# Patient Record
Sex: Female | Born: 1995 | Race: White | Hispanic: No | Marital: Single | Smoking: Never smoker
Health system: Southern US, Community
[De-identification: ages and names within clinical notes are randomized; demographics above are authoritative.]

## PROBLEM LIST (undated history)

## (undated) DIAGNOSIS — L509 Urticaria, unspecified: Secondary | ICD-10-CM

## (undated) DIAGNOSIS — J45909 Unspecified asthma, uncomplicated: Secondary | ICD-10-CM

## (undated) HISTORY — PX: TYMPANOSTOMY TUBE PLACEMENT: SHX32

---
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
## 2018-01-11 DIAGNOSIS — Z3041 Encounter for surveillance of contraceptive pills: Secondary | ICD-10-CM | POA: Diagnosis not present

## 2018-01-11 DIAGNOSIS — Z13220 Encounter for screening for lipoid disorders: Secondary | ICD-10-CM | POA: Diagnosis not present

## 2018-01-11 DIAGNOSIS — J301 Allergic rhinitis due to pollen: Secondary | ICD-10-CM | POA: Diagnosis not present

## 2018-01-11 DIAGNOSIS — Z0000 Encounter for general adult medical examination without abnormal findings: Secondary | ICD-10-CM | POA: Diagnosis not present

## 2018-01-11 DIAGNOSIS — J45990 Exercise induced bronchospasm: Secondary | ICD-10-CM | POA: Diagnosis not present

## 2018-01-11 DIAGNOSIS — R634 Abnormal weight loss: Secondary | ICD-10-CM | POA: Diagnosis not present

## 2018-01-11 DIAGNOSIS — Z131 Encounter for screening for diabetes mellitus: Secondary | ICD-10-CM | POA: Diagnosis not present

## 2018-01-29 DIAGNOSIS — R809 Proteinuria, unspecified: Secondary | ICD-10-CM | POA: Diagnosis not present

## 2019-01-29 DIAGNOSIS — Z23 Encounter for immunization: Secondary | ICD-10-CM | POA: Diagnosis not present

## 2019-01-29 DIAGNOSIS — Z0000 Encounter for general adult medical examination without abnormal findings: Secondary | ICD-10-CM | POA: Diagnosis not present

## 2019-01-29 DIAGNOSIS — Z124 Encounter for screening for malignant neoplasm of cervix: Secondary | ICD-10-CM | POA: Diagnosis not present

## 2019-01-29 DIAGNOSIS — Z3041 Encounter for surveillance of contraceptive pills: Secondary | ICD-10-CM | POA: Diagnosis not present

## 2019-06-04 DIAGNOSIS — Z23 Encounter for immunization: Secondary | ICD-10-CM | POA: Diagnosis not present

## 2020-01-17 ENCOUNTER — Other Ambulatory Visit: Payer: Self-pay

## 2020-01-17 ENCOUNTER — Encounter: Payer: Self-pay | Admitting: Emergency Medicine

## 2020-01-17 ENCOUNTER — Ambulatory Visit
Admission: EM | Admit: 2020-01-17 | Discharge: 2020-01-17 | Disposition: A | Discharge status: Home / Self Care | Payer: 59 | Attending: Physician Assistant | Admitting: Physician Assistant

## 2020-01-17 DIAGNOSIS — L539 Erythematous condition, unspecified: Secondary | ICD-10-CM

## 2020-01-17 DIAGNOSIS — W57XXXA Bitten or stung by nonvenomous insect and other nonvenomous arthropods, initial encounter: Secondary | ICD-10-CM | POA: Diagnosis not present

## 2020-01-17 DIAGNOSIS — S80861A Insect bite (nonvenomous), right lower leg, initial encounter: Secondary | ICD-10-CM | POA: Diagnosis not present

## 2020-01-17 MED ORDER — METHYLPREDNISOLONE 4 MG PO TBPK: ORAL_TABLET | ORAL | 0 refills | Status: AC

## 2020-01-17 NOTE — ED Provider Notes (Signed)
[facility]    CSN: [id number] Arrival date & time: 01/17/20  1505      History   Chief Complaint Chief Complaint  Patient presents with  . Insect Bite    HPI Rita Walker is a 24 y.o. female.   24 year old female presents for insect bite or sting of the right ankle.  She says she was bit or stung by something about 2 days ago.  She has been taking Zyrtec every other day and says that the area of redness and swelling seems to be getting a little bigger.  It is tender to touch and itchy.  She denies fever, fatigue, pain on weightbearing, numbness or tingling.  No other concerns today.     Past Medical History:  Diagnosis Date  . Asthma   . Hives     There are no problems to display for this patient.   Past Surgical History:  Procedure Laterality Date  . TYMPANOSTOMY TUBE PLACEMENT      OB History   No obstetric history on file.      Home Medications    Prior to Admission medications   Medication Sig Start Date End Date Taking? Authorizing Provider  cetirizine (ZYRTEC) 10 MG tablet Take 10 mg by mouth daily.   Yes [provider]  drospirenone-ethinyl estradiol (YASMIN,ZARAH,SYEDA) 3-0.03 MG tablet Take 1 tablet by mouth daily.   Yes [provider]  albuterol (PROVENTIL) (2.5 MG/3ML) 0.083% nebulizer solution Take 2.5 mg by nebulization every 6 (six) hours as needed for wheezing or shortness of breath.    [provider]  methylPREDNISolone (MEDROL DOSEPAK) 4 MG TBPK tablet Take according to dose pack instructions 01/17/20 01/23/20  Eusebio Friendly B, PA-C  montelukast (SINGULAIR) 10 MG tablet Take 10 mg by mouth at bedtime.    [provider]  ranitidine (ZANTAC) 150 MG capsule Take 150 mg twice daily for next 5-7 days 11/30/14   Rae Halsted, PA-C    Family History Family History  Problem Relation Age of Onset  . Healthy Father   . Healthy Mother     Social History Social History   Tobacco Use  . Smoking  status: Never Smoker  . Smokeless tobacco: Never Used  Vaping Use  . Vaping Use: Never used  Substance Use Topics  . Alcohol use: No  . Drug use: No     Allergies   Patient has no known allergies.   Review of Systems Review of Systems  Constitutional: Negative for fatigue and fever.  Musculoskeletal: Positive for joint swelling. Negative for arthralgias and myalgias.  Skin: Positive for color change. Negative for wound.  Allergic/Immunologic: Negative for immunocompromised state.  Neurological: Negative for weakness and numbness.  Hematological: Negative for adenopathy. Does not bruise/bleed easily.     Physical Exam Triage Vital Signs ED Triage Vitals  Enc Vitals Group     BP 01/17/20 1537 111/81     Pulse Rate 01/17/20 1537 61     Resp 01/17/20 1537 18     Temp 01/17/20 1537 98.8 F (37.1 C)     Temp Source 01/17/20 1537 Oral     SpO2 01/17/20 1537 100 %     Weight 01/17/20 1532 132 lb 0.9 oz (59.9 kg)     Height 01/17/20 1532 5\' 6"  (1.676 m)     Head Circumference --      Peak Flow --      Pain Score 01/17/20 1532 5     Pain Loc --  Pain Edu? --      Excl. in GC? --    No data found.  Updated Vital Signs BP 111/81 (BP Location: Left Arm)   Pulse 61   Temp 98.8 F (37.1 C) (Oral)   Resp 18   Ht 5\' 6"  (1.676 m)   Wt 132 lb 0.9 oz (59.9 kg)   LMP 01/14/2020   SpO2 100%   BMI 21.31 kg/m       Physical Exam Vitals and nursing note reviewed.  Constitutional:      General: She is not in acute distress.    Appearance: Normal appearance. She is not ill-appearing or toxic-appearing.  HENT:     Head: Normocephalic and atraumatic.  Eyes:     General: No scleral icterus.       Right eye: No discharge.        Left eye: No discharge.     Conjunctiva/sclera: Conjunctivae normal.  Cardiovascular:     Rate and Rhythm: Normal rate and regular rhythm.  Pulmonary:     Effort: Pulmonary effort is normal. No respiratory distress.  Musculoskeletal:      Cervical back: Neck supple.  Skin:    General: Skin is dry.     Findings: Erythema (there is an area of erythema and mild swelling of the right anterio ankle ~3 cm x 2.5 cm. Area mildly tender to touch and warm. No fluctuance or drainage) present.  Neurological:     General: No focal deficit present.     Mental Status: She is alert. Mental status is at baseline.     Motor: No weakness.     Gait: Gait normal.  Psychiatric:        Mood and Affect: Mood normal.        Behavior: Behavior normal.        Thought Content: Thought content normal.      UC Treatments / Results  Labs (all labs ordered are listed, but only abnormal results are displayed) Labs Reviewed - No data to display  EKG   Radiology No results found.  Procedures Procedures (including critical care time)  Medications Ordered in UC Medications - No data to display  Initial Impression / Assessment and Plan / UC Course  I have reviewed the triage vital signs and the nursing notes.  Pertinent labs & imaging results that were available during my care of the patient were reviewed by me and considered in my medical decision making (see chart for details).   Advised patient that condition is consistent with localized inflammatory reaction due to insect bite or sting.  Advised her that it should get better with antihistamines more frequently, icing and elevating the foot/ankle, starting Medrol.  Advised her to keep a close eye on the area and if it is worsening or not better within a couple of days she should call our office and request antibiotics.  I discussed sending it for her at this time but she said she plans to wait and only call if she needs it.  ED precautions discussed.  Final Clinical Impressions(s) / UC Diagnoses   Final diagnoses:  Insect bite of right lower leg, initial encounter  Erythematous condition     Discharge Instructions     Condition is consistent with a localized inflammatory action due to  insect bite or sting.  Take Benadryl tablets every 4-6 hours.  Also you can ice the area of swelling and elevate the foot.  Begin Medrol Dosepak today.  Drawn outline  of the area of redness and if it is spreading or if you have not started to have some relief with this treatment plan in the next 1 to 2 days, you should start antibiotics.  Call our office if you want [place] to prescribe them.    ED Prescriptions    Medication Sig Dispense Auth. Provider   methylPREDNISolone (MEDROL DOSEPAK) 4 MG TBPK tablet Take according to dose pack instructions 21 tablet Shirlee Latch, PA-C     PDMP not reviewed this encounter.   Shirlee Latch, PA-C 01/17/20 1600

## 2020-01-17 NOTE — Discharge Instructions (Addendum)
Condition is consistent with a localized inflammatory action due to insect bite or sting.  Take Benadryl tablets every 4-6 hours.  Also you can ice the area of swelling and elevate the foot.  Begin Medrol Dosepak today.  Drawn outline of the area of redness and if it is spreading or if you have not started to have some relief with this treatment plan in the next 1 to 2 days, you should start antibiotics.  Call our office if you want [place] to prescribe them.

## 2020-01-17 NOTE — ED Triage Notes (Signed)
Pt c/o insect bite on her ankle area. Area is swollen, red, warm, itchy and painful. Her foot also looks swollen. Started about 2 days ago. 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 She has tried oral antihistamines the last couple eo days without relief.

## 2020-07-02 DIAGNOSIS — R062 Wheezing: Secondary | ICD-10-CM | POA: Diagnosis not present

## 2020-07-02 DIAGNOSIS — Z3041 Encounter for surveillance of contraceptive pills: Secondary | ICD-10-CM | POA: Diagnosis not present

## 2020-10-07 DIAGNOSIS — Z23 Encounter for immunization: Secondary | ICD-10-CM | POA: Diagnosis not present

## 2020-10-07 DIAGNOSIS — J4520 Mild intermittent asthma, uncomplicated: Secondary | ICD-10-CM | POA: Diagnosis not present

## 2020-10-07 DIAGNOSIS — N898 Other specified noninflammatory disorders of vagina: Secondary | ICD-10-CM | POA: Diagnosis not present

## 2020-10-07 DIAGNOSIS — Z114 Encounter for screening for human immunodeficiency virus [HIV]: Secondary | ICD-10-CM | POA: Diagnosis not present

## 2020-10-07 DIAGNOSIS — Z0000 Encounter for general adult medical examination without abnormal findings: Secondary | ICD-10-CM | POA: Diagnosis not present

## 2020-10-07 DIAGNOSIS — Z1159 Encounter for screening for other viral diseases: Secondary | ICD-10-CM | POA: Diagnosis not present

## 2020-10-07 DIAGNOSIS — R69 Illness, unspecified: Secondary | ICD-10-CM | POA: Diagnosis not present

## 2020-10-07 DIAGNOSIS — D229 Melanocytic nevi, unspecified: Secondary | ICD-10-CM | POA: Diagnosis not present

## 2020-10-07 DIAGNOSIS — Z113 Encounter for screening for infections with a predominantly sexual mode of transmission: Secondary | ICD-10-CM | POA: Diagnosis not present

## 2020-10-07 DIAGNOSIS — Z13220 Encounter for screening for lipoid disorders: Secondary | ICD-10-CM | POA: Diagnosis not present

## 2020-10-07 DIAGNOSIS — Z131 Encounter for screening for diabetes mellitus: Secondary | ICD-10-CM | POA: Diagnosis not present

## 2020-11-27 ENCOUNTER — Ambulatory Visit (INDEPENDENT_AMBULATORY_CARE_PROVIDER_SITE_OTHER): Payer: 59

## 2020-11-27 ENCOUNTER — Other Ambulatory Visit: Payer: Self-pay

## 2020-11-27 ENCOUNTER — Ambulatory Visit
Admission: EM | Admit: 2020-11-27 | Discharge: 2020-11-27 | Disposition: A | Discharge status: Home / Self Care | Payer: 59 | Attending: Emergency Medicine | Admitting: Emergency Medicine

## 2020-11-27 DIAGNOSIS — R059 Cough, unspecified: Secondary | ICD-10-CM | POA: Diagnosis not present

## 2020-11-27 DIAGNOSIS — J4521 Mild intermittent asthma with (acute) exacerbation: Secondary | ICD-10-CM | POA: Diagnosis not present

## 2020-11-27 MED ORDER — BENZONATATE 100 MG PO CAPS: 200.0000 mg | ORAL_CAPSULE | Freq: Three times a day (TID) | ORAL | 0 refills | Status: DC

## 2020-11-27 MED ORDER — AEROCHAMBER MV MISC: 2 refills | Status: DC

## 2020-11-27 MED ORDER — PREDNISONE 50 MG PO TABS: ORAL_TABLET | ORAL | 0 refills | Status: DC

## 2020-11-27 NOTE — Discharge Instructions (Addendum)
Continue to use your albuterol inhaler with a spacer, 2 puffs every 4-6 hours as needed for cough or shortness of breath.  Tomorrow morning start taking the prednisone.  You will take 50 mg with breakfast each morning for period of 5 days.  Use the Tessalon Perles every 8 hours as needed for cough.  Take these with a small sip of water.  They may give you numbness to the base of your tongue or metallic taste in her mouth, this is normal.  Return for reevaluation, or see your primary care provider, for any new or worsening symptoms.

## 2020-11-27 NOTE — ED Triage Notes (Signed)
Patient states that she has been having respiratory symptoms x months. States that on the 10th she had a coughing spell where she feels like she cannot catch her breath, states that this has happened several times intermittently. States that she feels like this has improved some. States that she has been able to use her inhaler and it gets better.

## 2020-11-27 NOTE — ED Provider Notes (Signed)
[facility]    CSN: [id number] Arrival date & time: 11/27/20  1601      History   Chief Complaint Chief Complaint  Patient presents with   Cough    HPI Rita Walker is a 25 y.o. female.   HPI  25 year old female here for evaluation of cough.  Patient reports that for the last 5 days she has been experiencing intermittent coughing spells that seem to be worse when she is laying down, eating, or talking.  These episodes wax and wane.  Occasionally they are associated with wheezing and chest tightness.  In general her cough is nonproductive.  She has not had a fever or any upper respiratory symptoms.  She reports that her symptoms started after she traveled to [place] but she has had no travel out of the country.  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  She does have an albuterol inhaler which she reports helps for short periods of time with her cough.  Past Medical History:  Diagnosis Date   Asthma    Hives     There are no problems to display for this patient.   Past Surgical History:  Procedure Laterality Date   TYMPANOSTOMY TUBE PLACEMENT      OB History   No obstetric history on file.      Home Medications    Prior to Admission medications   Medication Sig Start Date End Date Taking? Authorizing Provider  albuterol (PROVENTIL) (2.5 MG/3ML) 0.083% nebulizer solution Take 2.5 mg by nebulization every 6 (six) hours as needed for wheezing or shortness of breath.   Yes [provider]  benzonatate (TESSALON) 100 MG capsule Take 2 capsules (200 mg total) by mouth every 8 (eight) hours. 11/27/20  Yes Becky Augusta, NP  cetirizine (ZYRTEC) 10 MG tablet Take 10 mg by mouth daily.   Yes [provider]  drospirenone-ethinyl estradiol (YASMIN,ZARAH,SYEDA) 3-0.03 MG tablet Take 1 tablet by mouth daily.   Yes [provider]  predniSONE (DELTASONE) 50 MG  tablet Take 1 tablet daily for 5 days. 11/27/20  Yes Becky Augusta, NP  Spacer/Aero-Holding Deretha Emory (AEROCHAMBER MV) inhaler Use as instructed 11/27/20  Yes Becky Augusta, NP    Family History Family History  Problem Relation Age of Onset   Healthy Father    Healthy Mother     Social History Social History   Tobacco Use   Smoking status: Never   Smokeless tobacco: Never  Vaping Use   Vaping Use: Never used  Substance Use Topics   Alcohol use: No   Drug use: No     Allergies   Patient has no known allergies.   Review of Systems Review of Systems  Constitutional:  Negative for fever.  HENT:  Negative for congestion, ear pain, rhinorrhea and sore throat.   Respiratory:  Positive for cough, chest tightness, shortness of breath and wheezing.   Cardiovascular:  Negative for chest pain.  Hematological: Negative.   Psychiatric/Behavioral: Negative.      Physical Exam Triage Vital Signs ED Triage Vitals  Enc Vitals Group     BP 11/27/20 1623 116/80     Pulse Rate 11/27/20 1623 82     Resp 11/27/20 1623 18     Temp 11/27/20 1623 98.8 F (37.1 C)     Temp Source 11/27/20 1623 Oral     SpO2 11/27/20 1623 99 %     Weight  11/27/20 1622 120 lb (54.4 kg)     Height 11/27/20 1622 5\' 6"  (1.676 m)     Head Circumference --      Peak Flow --      Pain Score 11/27/20 1622 0     Pain Loc --      Pain Edu? --      Excl. in GC? --    No data found.  Updated Vital Signs BP 116/80 (BP Location: Right Arm)   Pulse 82   Temp 98.8 F (37.1 C) (Oral)   Resp 18   Ht 5\' 6"  (1.676 m)   Wt 120 lb (54.4 kg)   SpO2 99%   BMI 19.37 kg/m   Visual Acuity Right Eye Distance:   Left Eye Distance:   Bilateral Distance:    Right Eye Near:   Left Eye Near:    Bilateral Near:     Physical Exam Vitals and nursing note reviewed.  Constitutional:      General: She is not in acute distress.    Appearance: Normal appearance. She is normal weight. She is not ill-appearing.  HENT:      Head: Normocephalic and atraumatic.     Nose: Nose normal. No congestion or rhinorrhea.     Mouth/Throat:     Mouth: Mucous membranes are moist.     Pharynx: Oropharynx is clear. No posterior oropharyngeal erythema.  Cardiovascular:     Rate and Rhythm: Normal rate and regular rhythm.     Pulses: Normal pulses.     Heart sounds: Normal heart sounds. No murmur heard.   No gallop.  Pulmonary:     Effort: Pulmonary effort is normal.     Breath sounds: No wheezing, rhonchi or rales.  Musculoskeletal:     Cervical back: Normal range of motion and neck supple.  Lymphadenopathy:     Cervical: No cervical adenopathy.  Skin:    General: Skin is warm and dry.     Capillary Refill: Capillary refill takes less than 2 seconds.     Findings: No erythema or rash.  Neurological:     General: No focal deficit present.     Mental Status: She is alert and oriented to person, place, and time.  Psychiatric:        Mood and Affect: Mood normal.        Behavior: Behavior normal.        Thought Content: Thought content normal.        Judgment: Judgment normal.     UC Treatments / Results  Labs (all labs ordered are listed, but only abnormal results are displayed) Labs Reviewed - No data to display  EKG   Radiology DG Chest 2 View  Result Date: 11/27/2020 CLINICAL DATA:  Cough EXAM: CHEST - 2 VIEW COMPARISON:  None. FINDINGS: The heart size and mediastinal contours are within normal limits. Both lungs are clear. The visualized skeletal structures are unremarkable. IMPRESSION: No active cardiopulmonary disease. Electronically Signed   By: M.D.   On: 11/27/2020 17:22    Procedures Procedures (including critical care time)  Medications Ordered in UC Medications - No data to display  Initial Impression / Assessment and Plan / UC Course  I have reviewed the triage vital signs and the nursing notes.  Pertinent labs & imaging results that were available during my care of the  patient were reviewed by me and considered in my medical decision making (see chart for details).  Patient is a  very pleasant, nontoxic-appearing 25 year old female here for evaluation of respiratory complaints as outlined in the HPI above.  Patient is able to speak in full sentences without any cough or dyspnea.  Patient's cardiopulmonary exam reveals clear lung sounds in all fields.  Heart sounds are S1-S2.  Upper respiratory exam reveals pink and moist nasal mucosa without erythema, edema, or discharge.  Oropharyngeal exam is benign.  No cervical lymphadenopathy appreciated exam.  Suspect patient has mild cough variant asthma.  Will obtain chest x-ray to look for hyper inflation or other cardiopulmonary irregularity.  Chest x-ray independently reviewed and evaluated by me.  Interpretation: No evidence of infiltrate or effusion.  There is mild hyperinflation present.  Awaiting radiology overread. Radiology over read states no active cardiopulmonary disease.  We will treat patient for cough or an asthma flare and have her continue to use her albuterol inhaler, 2 puffs with a spacer every 4-6 hours, start prednisone 50 mg daily starting tomorrow morning, and will give Tessalon Perles to help with cough symptoms.  Final Clinical Impressions(s) / UC Diagnoses   Final diagnoses:  Mild intermittent asthma with exacerbation     Discharge Instructions      Continue to use your albuterol inhaler with a spacer, 2 puffs every 4-6 hours as needed for cough or shortness of breath.  Tomorrow morning start taking the prednisone.  You will take 50 mg with breakfast each morning for period of 5 days.  Use the Tessalon Perles every 8 hours as needed for cough.  Take these with a small sip of water.  They may give you numbness to the base of your tongue or metallic taste in her mouth, this is normal.  Return for reevaluation, or see your primary care provider, for any new or worsening symptoms.     ED  Prescriptions     Medication Sig Dispense Auth. Provider   Spacer/Aero-Holding Chambers (AEROCHAMBER MV) inhaler Use as instructed 1 each Becky Augusta, NP   benzonatate (TESSALON) 100 MG capsule Take 2 capsules (200 mg total) by mouth every 8 (eight) hours. 21 capsule Becky Augusta, NP   predniSONE (DELTASONE) 50 MG tablet Take 1 tablet daily for 5 days. 5 tablet Becky Augusta, NP      PDMP not reviewed this encounter.   Becky Augusta, NP 11/27/20 1734

## 2020-12-25 DIAGNOSIS — R062 Wheezing: Secondary | ICD-10-CM | POA: Diagnosis not present

## 2020-12-25 DIAGNOSIS — R053 Chronic cough: Secondary | ICD-10-CM | POA: Diagnosis not present

## 2021-03-15 DIAGNOSIS — S99912A Unspecified injury of left ankle, initial encounter: Secondary | ICD-10-CM | POA: Diagnosis not present

## 2021-03-15 DIAGNOSIS — M7989 Other specified soft tissue disorders: Secondary | ICD-10-CM | POA: Diagnosis not present

## 2021-07-28 DIAGNOSIS — R69 Illness, unspecified: Secondary | ICD-10-CM | POA: Diagnosis not present

## 2021-07-28 DIAGNOSIS — Z124 Encounter for screening for malignant neoplasm of cervix: Secondary | ICD-10-CM | POA: Diagnosis not present

## 2021-07-28 DIAGNOSIS — N898 Other specified noninflammatory disorders of vagina: Secondary | ICD-10-CM | POA: Diagnosis not present

## 2021-07-28 DIAGNOSIS — Z113 Encounter for screening for infections with a predominantly sexual mode of transmission: Secondary | ICD-10-CM | POA: Diagnosis not present

## 2021-07-28 DIAGNOSIS — Z01419 Encounter for gynecological examination (general) (routine) without abnormal findings: Secondary | ICD-10-CM | POA: Diagnosis not present

## 2021-08-03 LAB — HM PAP SMEAR: HM Pap smear: NORMAL

## 2021-12-14 IMAGING — CR DG CHEST 2V
3 series · 3 of 3 positions shown · non-contrast
Comparison: None.

CLINICAL DATA: Cough

EXAM:
CHEST - 2 VIEW

[chest pa (1 of 2)]
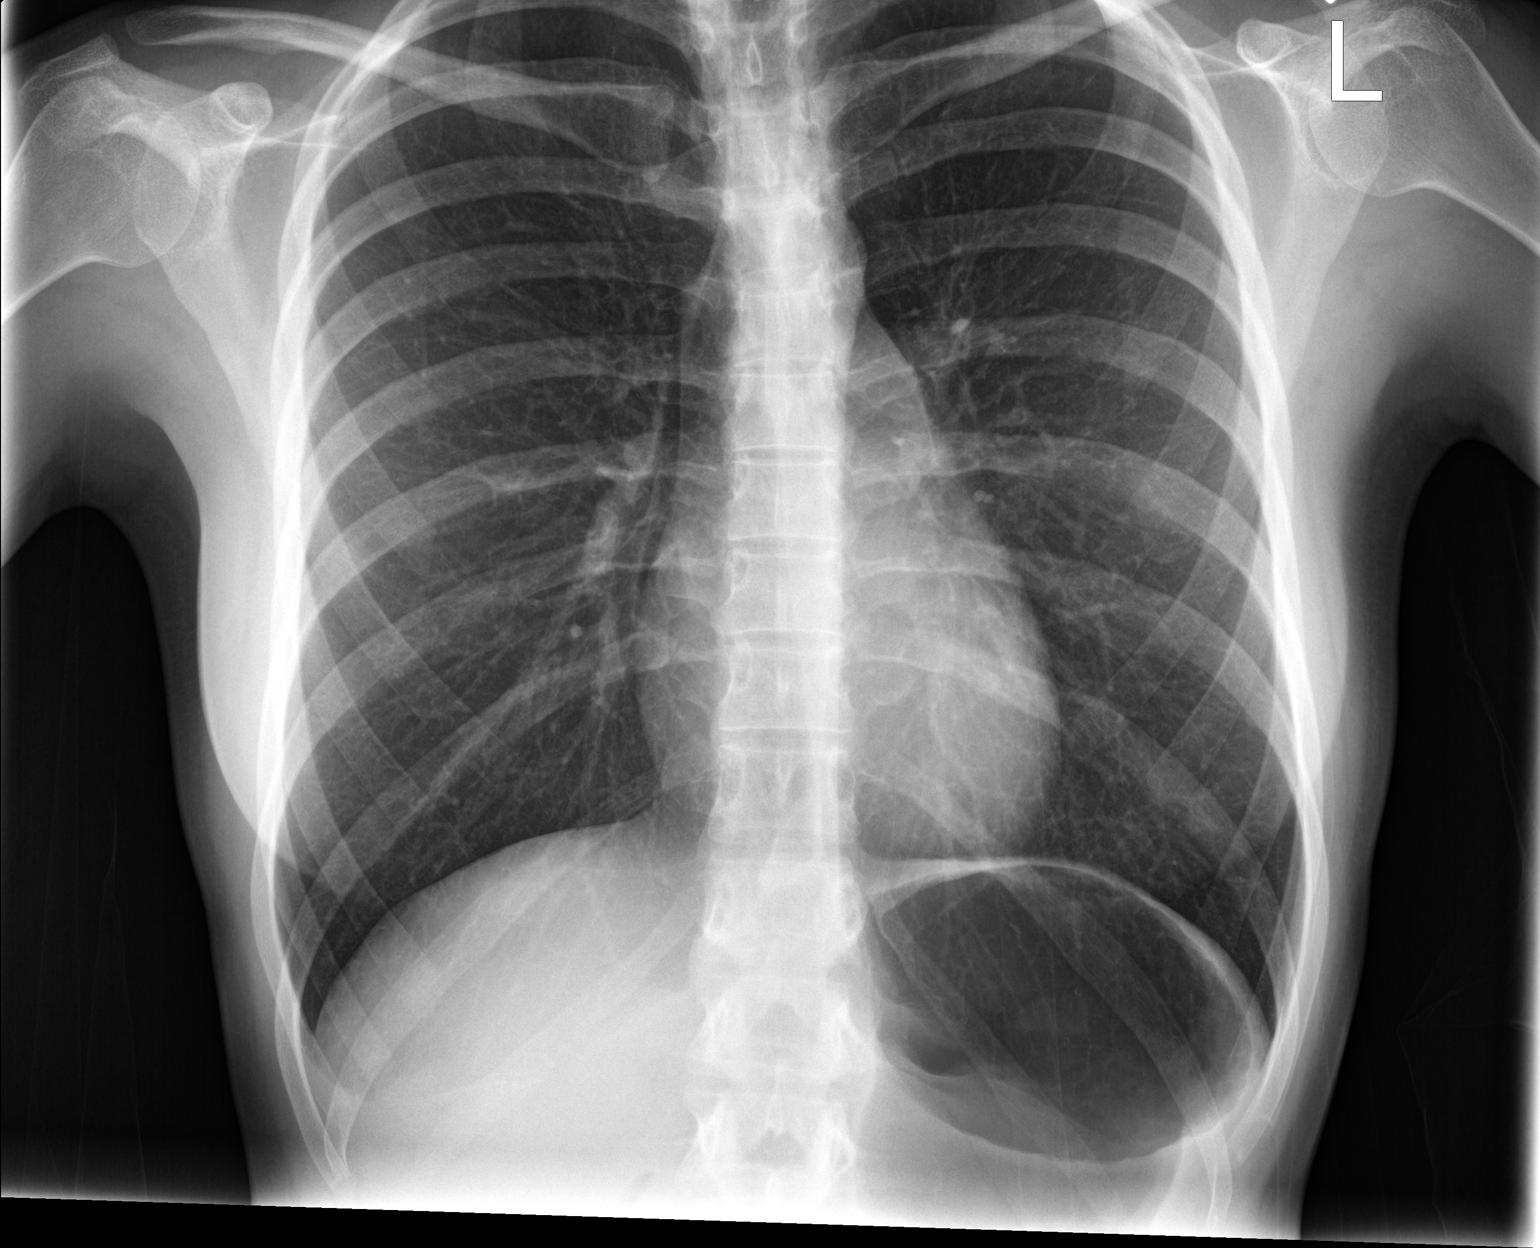

[chest pa (2 of 2)]
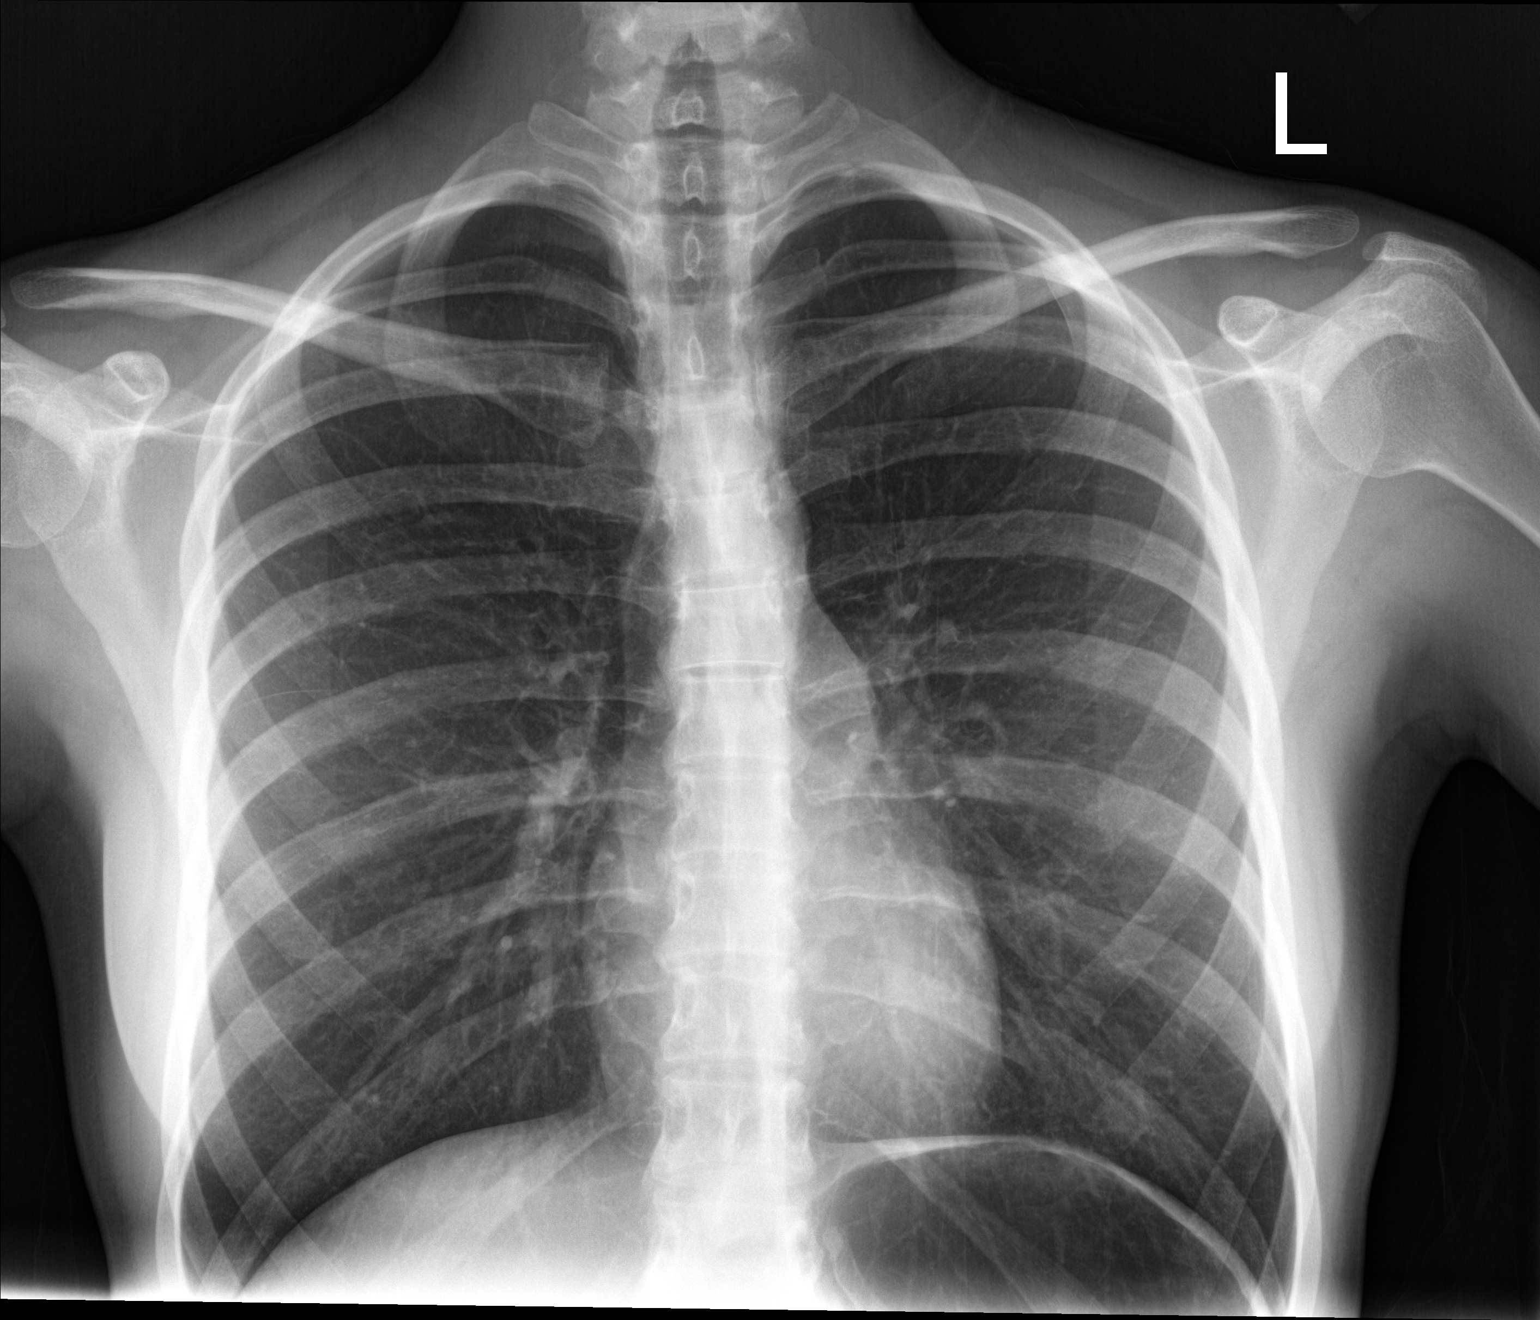

[chest lat]
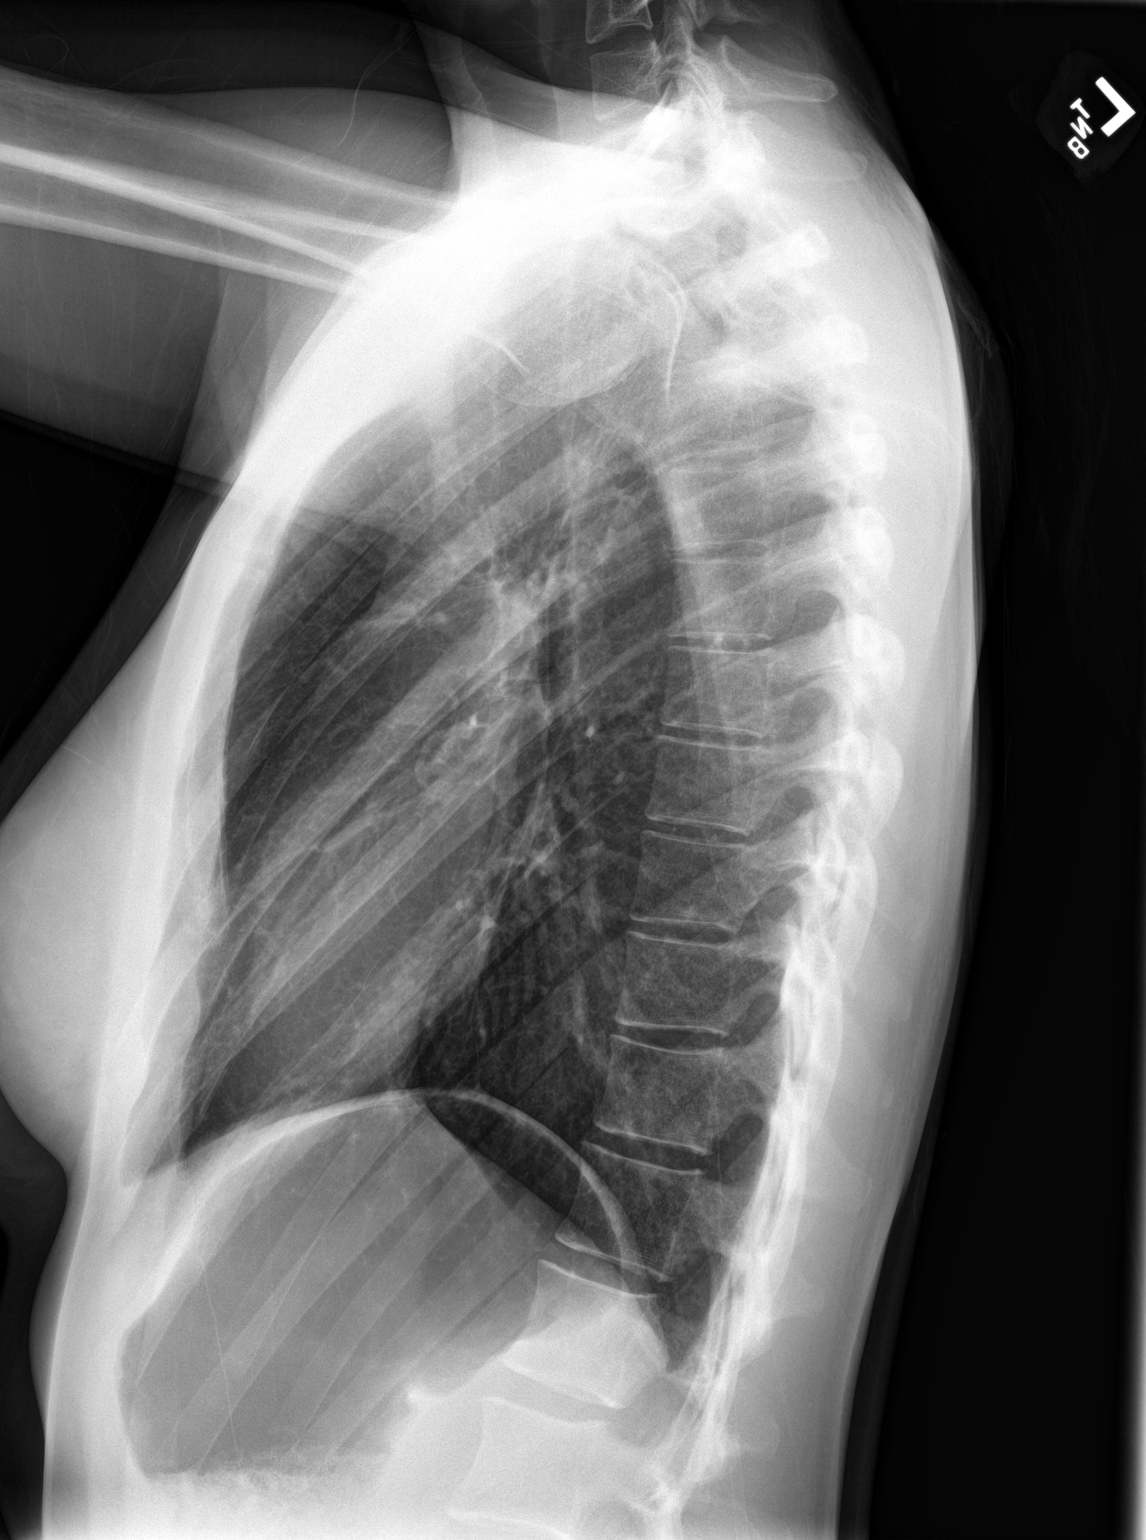

[3 of 3 positions shown; findings below may reference images not displayed]

FINDINGS: The heart size and mediastinal contours are within normal limits.
Both lungs are clear. The visualized skeletal structures are
unremarkable.
IMPRESSION: No active cardiopulmonary disease.

## 2022-07-28 DIAGNOSIS — L989 Disorder of the skin and subcutaneous tissue, unspecified: Secondary | ICD-10-CM | POA: Diagnosis not present

## 2022-07-28 DIAGNOSIS — Z3041 Encounter for surveillance of contraceptive pills: Secondary | ICD-10-CM | POA: Diagnosis not present

## 2022-07-28 DIAGNOSIS — Z1331 Encounter for screening for depression: Secondary | ICD-10-CM | POA: Diagnosis not present

## 2022-07-28 DIAGNOSIS — Z0000 Encounter for general adult medical examination without abnormal findings: Secondary | ICD-10-CM | POA: Diagnosis not present

## 2022-07-28 DIAGNOSIS — Z1330 Encounter for screening examination for mental health and behavioral disorders, unspecified: Secondary | ICD-10-CM | POA: Diagnosis not present

## 2023-05-24 DIAGNOSIS — M9905 Segmental and somatic dysfunction of pelvic region: Secondary | ICD-10-CM | POA: Diagnosis not present

## 2023-05-24 DIAGNOSIS — M542 Cervicalgia: Secondary | ICD-10-CM | POA: Diagnosis not present

## 2023-05-24 DIAGNOSIS — M9902 Segmental and somatic dysfunction of thoracic region: Secondary | ICD-10-CM | POA: Diagnosis not present

## 2023-05-24 DIAGNOSIS — M9901 Segmental and somatic dysfunction of cervical region: Secondary | ICD-10-CM | POA: Diagnosis not present

## 2023-05-24 DIAGNOSIS — M5387 Other specified dorsopathies, lumbosacral region: Secondary | ICD-10-CM | POA: Diagnosis not present

## 2023-05-24 DIAGNOSIS — M9903 Segmental and somatic dysfunction of lumbar region: Secondary | ICD-10-CM | POA: Diagnosis not present

## 2023-05-24 DIAGNOSIS — M9904 Segmental and somatic dysfunction of sacral region: Secondary | ICD-10-CM | POA: Diagnosis not present

## 2023-05-29 DIAGNOSIS — M5387 Other specified dorsopathies, lumbosacral region: Secondary | ICD-10-CM | POA: Diagnosis not present

## 2023-05-29 DIAGNOSIS — M9905 Segmental and somatic dysfunction of pelvic region: Secondary | ICD-10-CM | POA: Diagnosis not present

## 2023-05-29 DIAGNOSIS — M9901 Segmental and somatic dysfunction of cervical region: Secondary | ICD-10-CM | POA: Diagnosis not present

## 2023-05-29 DIAGNOSIS — M9903 Segmental and somatic dysfunction of lumbar region: Secondary | ICD-10-CM | POA: Diagnosis not present

## 2023-05-29 DIAGNOSIS — M9902 Segmental and somatic dysfunction of thoracic region: Secondary | ICD-10-CM | POA: Diagnosis not present

## 2023-05-29 DIAGNOSIS — M542 Cervicalgia: Secondary | ICD-10-CM | POA: Diagnosis not present

## 2023-05-29 DIAGNOSIS — M9904 Segmental and somatic dysfunction of sacral region: Secondary | ICD-10-CM | POA: Diagnosis not present

## 2023-06-29 ENCOUNTER — Other Ambulatory Visit: Payer: Self-pay

## 2023-06-30 ENCOUNTER — Ambulatory Visit: Payer: 59 | Admitting: Family Medicine

## 2023-07-03 ENCOUNTER — Encounter: Payer: Self-pay | Admitting: Family Medicine

## 2023-07-03 ENCOUNTER — Ambulatory Visit: Payer: 59 | Admitting: Family Medicine

## 2023-07-03 VITALS — BP 122/86 | HR 88 | Temp 98.2°F | Resp 18 | Ht 67.0 in | Wt 129.0 lb

## 2023-07-03 DIAGNOSIS — R1084 Generalized abdominal pain: Secondary | ICD-10-CM

## 2023-07-03 DIAGNOSIS — Z7689 Persons encountering health services in other specified circumstances: Secondary | ICD-10-CM

## 2023-07-03 DIAGNOSIS — R198 Other specified symptoms and signs involving the digestive system and abdomen: Secondary | ICD-10-CM

## 2023-07-03 DIAGNOSIS — Z0000 Encounter for general adult medical examination without abnormal findings: Secondary | ICD-10-CM | POA: Diagnosis not present

## 2023-07-03 NOTE — Patient Instructions (Signed)

## 2023-07-03 NOTE — Progress Notes (Signed)
 New Patient Office Visit  Subjective  Patient ID: Rita Walker, female    DOB: 06-18-1995  Age: 28 y.o. MRN: [id number]  CC:  Chief Complaint  Patient presents with   Establish Care   Diarrhea   Constipation   Asthma   HPI Rita Walker is a 28 year old female who presents to establish with [facility].   CC: Patient here to establish care  Last PCP: [facility] here and trying to find location closer to home-- kept getting her appts pushed back  Currently in school full-time   ALTERNATING BOWEL MOVEMENTS: Fluctuating bowel movements- "has been going on for a while;" varies between diarrhea and constipation. Has been dealing with it for so long and her father convinced her to be seen.  When she goes too long without eating, gets lethargic and nauseous.  Can't tell if it's general blockage, stress related, diet  Denies hematemesis, melena/hematochezia, N/V, indigestion, fever/chills, fatigue and  urinary symptoms.  Reports occasional nausea.  Abdominal pain with BMs intermittently- sometimes it will go away after BM, sometimes the "brunt of the pain is gone" but a gradual/dull ache remains.  Felt like BMs and hungry cues were more routine with routine eating schedule. Reports she go hours without any food intake. Unsure if she has early satiety- but is not able to eat an entire "small" steak when she feels hungry enough that she could.  The best way she can describe it is that her "appetite feels off."  Outpatient Encounter Medications as of 07/03/2023  Medication Sig   albuterol (VENTOLIN HFA) 108 (90 Base) MCG/ACT inhaler Inhale 2 puffs into the lungs every 6 (six) hours as needed for wheezing.   cetirizine (ZYRTEC) 10 MG tablet Take 10 mg by mouth daily.   drospirenone-ethinyl estradiol (YASMIN,ZARAH,SYEDA) 3-0.03 MG tablet Take 1 tablet by mouth daily.   [DISCONTINUED] albuterol (PROVENTIL) (2.5 MG/3ML) 0.083% nebulizer  solution Take 2.5 mg by nebulization every 6 (six) hours as needed for wheezing or shortness of breath. (Patient not taking: Reported on 07/03/2023)   [DISCONTINUED] benzonatate (TESSALON) 100 MG capsule Take 2 capsules (200 mg total) by mouth every 8 (eight) hours. (Patient not taking: Reported on 07/03/2023)   [DISCONTINUED] predniSONE (DELTASONE) 50 MG tablet Take 1 tablet daily for 5 days. (Patient not taking: Reported on 07/03/2023)   [DISCONTINUED] Spacer/Aero-Holding Chambers (AEROCHAMBER MV) inhaler Use as instructed (Patient not taking: Reported on 07/03/2023)   No facility-administered encounter medications on file as of 07/03/2023.    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   Asthma 04/19/2013   Past Medical History:  Diagnosis Date   Asthma    Hives    Past Surgical History:  Procedure Laterality Date   TYMPANOSTOMY TUBE PLACEMENT     Family History  Problem Relation Age of Onset   Healthy Father    Healthy Mother    Social History   Socioeconomic History   Marital status: Single    Spouse name: Not on file   Number of children: Not on file   Years of education: Not on file   Highest education level: Not on file  Occupational History   Not on file  Tobacco Use   Smoking status: Never    Passive exposure: Never   Smokeless tobacco: Never  Vaping Use   Vaping status: Never Used  Substance and Sexual Activity   Alcohol  use: No   Drug use: No   Sexual activity: Not Currently    Birth control/protection: Pill  Other Topics Concern   Not on file  Social History Narrative   Not on file   Social Drivers of Health   Financial Resource Strain: Patient Declined (07/27/2022)   Received from [facility]   Overall Financial Resource Strain (CARDIA)    Difficulty of Paying Living Expenses: Patient declined  Food Insecurity: No Food Insecurity (07/27/2022)   Received from  [facility]   Hunger Vital Sign    Worried About Running Out of Food in the Last Year: Never true    Ran Out of Food in the Last Year: Never true  Transportation Needs: No Transportation Needs (07/27/2022)   Received from [facility] - Transportation    In the past 12 months, has lack of transportation kept you from medical appointments or from getting medications?: No    Lack of Transportation (Non-Medical): No  Physical Activity: Not on file  Stress: Not on file  Social Connections: Not on file  Intimate Partner Violence: Not on file   Outpatient Medications Prior to Visit  Medication Sig Dispense Refill   albuterol (VENTOLIN HFA) 108 (90 Base) MCG/ACT inhaler Inhale 2 puffs into the lungs every 6 (six) hours as needed for wheezing.     cetirizine (ZYRTEC) 10 MG tablet Take 10 mg by mouth daily.     drospirenone-ethinyl estradiol (YASMIN,ZARAH,SYEDA) 3-0.03 MG tablet Take 1 tablet by mouth daily.     albuterol (PROVENTIL) (2.5 MG/3ML) 0.083% nebulizer solution Take 2.5 mg by nebulization every 6 (six) hours as needed for wheezing or shortness of breath. (Patient not taking: Reported on 07/03/2023)     benzonatate (TESSALON) 100 MG capsule Take 2 capsules (200 mg total) by mouth every 8 (eight) hours. (Patient not taking: Reported on 07/03/2023) 21 capsule 0   predniSONE (DELTASONE) 50 MG tablet Take 1 tablet daily for 5 days. (Patient not taking: Reported on 07/03/2023) 5 tablet 0   Spacer/Aero-Holding Chambers (AEROCHAMBER MV) inhaler Use as instructed (Patient not taking: Reported on 07/03/2023) 1 each 2   No facility-administered medications prior to visit.   No Known Allergies  Review of Systems  Constitutional:  Negative for chills, fever and malaise/fatigue.  Respiratory:  Negative for cough and shortness of breath.   Cardiovascular:  Negative for chest pain and palpitations.  Gastrointestinal:  Positive for abdominal pain (not every  time with BMs), constipation, diarrhea and nausea (if long periods throughout the day). Negative for blood in stool, heartburn, melena and vomiting.   Objective   Today's Vitals   07/03/23 1023  BP: 122/86  Pulse: 88  Resp: 18  Temp: 98.2 F (36.8 C)  TempSrc: Oral  SpO2: 98%  Weight: 129 lb (58.5 kg)  Height: 5\' 7"  (1.702 m)  PainSc: 0-No pain    GENERAL: Well-appearing, in NAD. Well nourished.  SKIN: Pink, warm and dry. No rash, lesion, ulceration, or ecchymoses.  Head: Normocephalic. RESPIRATORY: Chest wall symmetrical. Respirations even and non-labored. Breath sounds clear to auscultation bilaterally.  CARDIAC: S1, S2 present, regular rate and rhythm without murmur or gallops. Peripheral pulses 2+ bilaterally.  GI: Abdomen soft w/o distention. Normoactive bowel sounds. No palpable masses or tenderness. No guarding or rebound tenderness. Liver and spleen w/o tenderness or enlargement. No CVA tenderness.  MSK: Muscle tone and strength appropriate for age. Joints w/o tenderness, redness, or swelling.  EXTREMITIES: Without  clubbing, cyanosis, or edema.  NEUROLOGIC: No motor or sensory deficits. Steady, even gait. C2-C12 intact.  PSYCH/MENTAL STATUS: Alert, oriented x 3. Cooperative, appropriate mood and affect.    Assessment & Plan:   1. Encounter to establish care (Primary) Patient is a 23- year-old female who presents today to establish care with primary care at [facility] Reviewed the past medical history, family history, social history, surgical history, medications and allergies today- updates made as indicated. Patient has concerns today about alternating bowel movements.    2. Abnormal bowel movement Patient reports she has had chronic issues with bowel movements fluctuating between diarrhea and constipation. She reports that this was her "normal" and does not feel ill. Patient is well-appearing and in no acute distress. Physical exam reveals no current tenderness to  palpation. No rebound tenderness or guarding present. No CVA tenderness present. Denies urinary symptoms. Initial testing will include assessment of liver and kidney function. Will also obtain CBC to rule out infection. Discussed keeping a food diary to determine if there is a correlation with food. Advised patient that further work-up could involve imaging and/or referral to GI. Patient is agreeable to plan of care. Counseled regarding ED precautions and when to return to office.   3. Generalized abdominal pain See #2  4. Wellness examination Will obtain fasting labs today and plan for annual physical in near future.  - CBC with Differential/Platelet - Comprehensive metabolic panel - Hemoglobin A1c - Lipid panel - TSH Rfx on Abnormal to Free T4  Return in about 4 weeks (around 07/31/2023) for Physical.   Alyson Reedy, FNP

## 2023-07-04 ENCOUNTER — Encounter: Payer: Self-pay | Admitting: Family Medicine

## 2023-07-04 LAB — COMPREHENSIVE METABOLIC PANEL
ALT: 7 [IU]/L (ref 0–32)
AST: 12 [IU]/L (ref 0–40)
Albumin: 4.4 g/dL (ref 4.0–5.0)
Alkaline Phosphatase: 55 [IU]/L (ref 44–121)
BUN/Creatinine Ratio: 13 (ref 9–23)
BUN: 11 mg/dL (ref 6–20)
Bilirubin Total: <0.2 mg/dL (ref 0.0–1.2)
CO2: 21 mmol/L (ref 20–29)
Calcium: 9.5 mg/dL (ref 8.7–10.2)
Chloride: 103 mmol/L (ref 96–106)
Creatinine, Ser: 0.85 mg/dL (ref 0.57–1.00)
Globulin, Total: 2.7 g/dL (ref 1.5–4.5)
Glucose: 83 mg/dL (ref 70–99)
Potassium: 4.7 mmol/L (ref 3.5–5.2)
Sodium: 139 mmol/L (ref 134–144)
Total Protein: 7.1 g/dL (ref 6.0–8.5)
eGFR: 97 mL/min/{1.73_m2} (ref >59)

## 2023-07-04 LAB — LIPID PANEL
Chol/HDL Ratio: 2.8 {ratio} (ref 0.0–4.4)
Cholesterol, Total: 189 mg/dL (ref 100–199)
HDL: 67 mg/dL (ref >39)
LDL Chol Calc (NIH): 99 mg/dL (ref 0–99)
Triglycerides: 135 mg/dL (ref 0–149)
VLDL Cholesterol Cal: 23 mg/dL (ref 5–40)

## 2023-07-04 LAB — CBC WITH DIFFERENTIAL/PLATELET
Basophils Absolute: 0.1 10*3/uL (ref 0.0–0.2)
Basos: 1 %
EOS (ABSOLUTE): 0.7 10*3/uL — ABNORMAL HIGH (ref 0.0–0.4)
Eos: 10 %
Hematocrit: 42.5 % (ref 34.0–46.6)
Hemoglobin: 13.6 g/dL (ref 11.1–15.9)
Immature Grans (Abs): 0 10*3/uL (ref 0.0–0.1)
Immature Granulocytes: 0 %
Lymphocytes Absolute: 2.8 10*3/uL (ref 0.7–3.1)
Lymphs: 41 %
MCH: 27.9 pg (ref 26.6–33.0)
MCHC: 32 g/dL (ref 31.5–35.7)
MCV: 87 fL (ref 79–97)
Monocytes Absolute: 0.3 10*3/uL (ref 0.1–0.9)
Monocytes: 4 %
Neutrophils Absolute: 3 10*3/uL (ref 1.4–7.0)
Neutrophils: 44 %
Platelets: 335 10*3/uL (ref 150–450)
RBC: 4.87 x10E6/uL (ref 3.77–5.28)
RDW: 11.8 % (ref 11.7–15.4)
WBC: 6.8 10*3/uL (ref 3.4–10.8)

## 2023-07-04 LAB — TSH RFX ON ABNORMAL TO FREE T4: TSH: 2.74 u[IU]/mL (ref 0.450–4.500)

## 2023-07-04 LAB — HEMOGLOBIN A1C
Est. average glucose Bld gHb Est-mCnc: 108 mg/dL
Hgb A1c MFr Bld: 5.4 % (ref 4.8–5.6)

## 2023-07-31 ENCOUNTER — Ambulatory Visit (INDEPENDENT_AMBULATORY_CARE_PROVIDER_SITE_OTHER): Payer: 59 | Admitting: Family Medicine

## 2023-07-31 ENCOUNTER — Encounter: Payer: Self-pay | Admitting: Family Medicine

## 2023-07-31 VITALS — BP 111/71 | HR 90 | Temp 98.2°F | Ht 67.0 in | Wt 132.8 lb

## 2023-07-31 DIAGNOSIS — J4520 Mild intermittent asthma, uncomplicated: Secondary | ICD-10-CM

## 2023-07-31 DIAGNOSIS — Z0000 Encounter for general adult medical examination without abnormal findings: Secondary | ICD-10-CM

## 2023-07-31 DIAGNOSIS — Z113 Encounter for screening for infections with a predominantly sexual mode of transmission: Secondary | ICD-10-CM | POA: Diagnosis not present

## 2023-07-31 MED ORDER — ALBUTEROL SULFATE HFA 108 (90 BASE) MCG/ACT IN AERS: 2.0000 | INHALATION_SPRAY | Freq: Four times a day (QID) | RESPIRATORY_TRACT | 2 refills | Status: AC | PRN

## 2023-07-31 NOTE — Progress Notes (Signed)
 Subjective:   Rita Walker [birth year]  07/31/2023   CC: Chief Complaint  Patient presents with   Annual Exam    Pt. Here for an annual exam.    Medication Refill    Pt. Need to have Albuterol inhaler refilled.    HPI: Rita Walker is a 28 y.o. female who presents for a routine health maintenance exam.  Fasting labs collected at previous visit.   HEALTH SCREENINGS: - Vision Screening: within the last year (sometime in Jan or Feb 2025) - Dental Visits: q6 months (next appt 08/2023) - Pap smear:  deferred today  - Breast Exam:  discussed SBEs - STD Screening: Ordered today - Mammogram (40+): Not applicable  - Colonoscopy (45+): Not applicable  - Bone Density (65+ or under 65 with predisposing conditions): Not applicable  - Lung CA screening with low-dose CT:  Not applicable Adults age 38-80 who are current cigarette smokers or quit within the last 15 years. Must have 20 pack year history.   Depression and Anxiety Screen done today and results listed below:     07/03/2023   10:25 AM  Depression screen PHQ 2/9  Decreased Interest 0  Down, Depressed, Hopeless 0  PHQ - 2 Score 0  Altered sleeping 0  Tired, decreased energy 0  Change in appetite 2  Feeling bad or failure about yourself  0  Trouble concentrating 0  Moving slowly or fidgety/restless 0  Suicidal thoughts 0  PHQ-9 Score 2  Difficult doing work/chores Not difficult at all   IMMUNIZATIONS: - Tdap: Tetanus vaccination status reviewed: last tetanus booster within 10 years. - HPV: Up to date - Influenza: Refused - Pneumovax: Not applicable - Prevnar 20: Not applicable - Zostavax (50+): Not applicable  Past medical history, surgical history, medications, allergies, family history and social history reviewed with patient today and changes made to appropriate areas of the chart.   Past Medical History:  Diagnosis Date   Asthma    Hives     Past Surgical History:  Procedure Laterality Date    TYMPANOSTOMY TUBE PLACEMENT      Current Outpatient Medications on File Prior to Visit  Medication Sig   cetirizine (ZYRTEC) 10 MG tablet Take 10 mg by mouth daily.   drospirenone-ethinyl estradiol (YASMIN,ZARAH,SYEDA) 3-0.03 MG tablet Take 1 tablet by mouth daily.   No current facility-administered medications on file prior to visit.   No Known Allergies  Social History   Socioeconomic History   Marital status: Single    Spouse name: Not on file   Number of children: Not on file   Years of education: Not on file   Highest education level: Not on file  Occupational History   Not on file  Tobacco Use   Smoking status: Never    Passive exposure: Never   Smokeless tobacco: Never  Vaping Use   Vaping status: Never Used  Substance and Sexual Activity   Alcohol use: No   Drug use: No   Sexual activity: Not Currently    Birth control/protection: Pill  Other Topics Concern   Not on file  Social History Narrative   Not on file   Social Drivers of Health   Financial Resource Strain: Patient Declined (07/27/2022)   Received from [facility]   Overall Financial Resource Strain (CARDIA)    Difficulty of Paying Living Expenses: Patient declined  Food Insecurity: No Food Insecurity (07/27/2022)   Received from [facility]   Hunger Vital Sign  Worried About Programme researcher, broadcasting/film/video in the Last Year: Never true    Ran Out of Food in the Last Year: Never true  Transportation Needs: No Transportation Needs (07/27/2022)   Received from [facility] - Transportation    In the past 12 months, has lack of transportation kept you from medical appointments or from getting medications?: No    Lack of Transportation (Non-Medical): No  Physical Activity: Not on file  Stress: Not on file  Social Connections: Not on file  Intimate Partner Violence: Not on file   Social History   Tobacco Use  Smoking Status Never   Passive  exposure: Never  Smokeless Tobacco Never   Social History   Substance and Sexual Activity  Alcohol Use No    Family History  Problem Relation Age of Onset   Healthy Mother    Healthy Father    Skin cancer Maternal Grandmother    Prostate cancer Maternal Uncle      ROS: Denies fever, fatigue, unexplained weight loss/gain, chest pain, SHOB, and palpitations. Denies neurological deficits, gastrointestinal or genitourinary complaints, and skin changes.   Objective:   Today's Vitals   07/31/23 1000  BP: 111/71  Pulse: 90  Temp: 98.2 F (36.8 C)  TempSrc: Oral  SpO2: 95%  Weight: 132 lb 12.8 oz (60.2 kg)  Height: 5\' 7"  (1.702 m)  PainSc: 0-No pain    GENERAL APPEARANCE: Well-appearing, in NAD. Well nourished.  SKIN: Pink, warm and dry. Turgor normal. No rash, lesion, ulceration, or ecchymoses. Hair evenly distributed.  HEENT: HEAD: Normocephalic.  EYES: PERRLA. EOMI. Lids intact w/o defect. Sclera white, Conjunctiva pink w/o exudate.  EARS: External ear w/o redness, swelling, masses or lesions. EAC clear. TM's intact, translucent w/o bulging, appropriate landmarks visualized. Appropriate acuity to conversational tones.  NOSE: Septum midline w/o deformity. Nares patent, mucosa pink and non-inflamed w/o drainage. No sinus tenderness.  THROAT: Uvula midline. Oropharynx clear. Tonsils non-inflamed w/o exudate. Oral mucosa pink and moist.  NECK: Supple, Trachea midline. Full ROM w/o pain or tenderness. No lymphadenopathy. Thyroid non-tender w/o enlargement or palpable masses.  BREASTS: Deferred.  RESPIRATORY: Chest wall symmetrical w/o masses. Respirations even and non-labored. Breath sounds clear to auscultation bilaterally. No wheezes, rales, rhonchi, or crackles. CARDIAC: S1, S2 present, regular rate and rhythm. No gallops, murmurs, rubs, or clicks. PMI w/o lifts, heaves, or thrills. No carotid bruits. Capillary refill <2 seconds. Peripheral pulses 2+ bilaterally. GI: Abdomen  soft w/o distention. Normoactive bowel sounds. No palpable masses or tenderness. No guarding or rebound tenderness. Liver and spleen w/o tenderness or enlargement. No CVA tenderness.  GU: Deferred.  MSK: Muscle tone and strength appropriate for age, w/o atrophy or abnormal movement.  EXTREMITIES: Active ROM intact, w/o tenderness, crepitus, or contracture. No obvious joint deformities or effusions. No clubbing, edema, or cyanosis.  NEUROLOGIC: CN's II-XII intact. Motor strength symmetrical with no obvious weakness. No sensory deficits. DTR's 2+ symmetric bilaterally. Steady, even gait.  PSYCH/MENTAL STATUS: Alert, oriented x 3. Cooperative, appropriate mood and affect.   Results for orders placed or performed in visit on 07/03/23  CBC with Differential/Platelet   Collection Time: 07/03/23 10:56 AM  Result Value Ref Range   WBC 6.8 3.4 - 10.8 x10E3/uL   RBC 4.87 3.77 - 5.28 x10E6/uL   Hemoglobin 13.6 11.1 - 15.9 g/dL   Hematocrit 36.6 44.0 - 46.6 %   MCV 87 79 - 97 fL   MCH 27.9 26.6 - 33.0 pg  MCHC 32.0 31.5 - 35.7 g/dL   RDW 78.2 95.6 - 21.3 %   Platelets 335 150 - 450 x10E3/uL   Neutrophils 44 Not Estab. %   Lymphs 41 Not Estab. %   Monocytes 4 Not Estab. %   Eos 10 Not Estab. %   Basos 1 Not Estab. %   Neutrophils Absolute 3.0 1.4 - 7.0 x10E3/uL   Lymphocytes Absolute 2.8 0.7 - 3.1 x10E3/uL   Monocytes Absolute 0.3 0.1 - 0.9 x10E3/uL   EOS (ABSOLUTE) 0.7 (H) 0.0 - 0.4 x10E3/uL   Basophils Absolute 0.1 0.0 - 0.2 x10E3/uL   Immature Granulocytes 0 Not Estab. %   Immature Grans (Abs) 0.0 0.0 - 0.1 x10E3/uL  Comprehensive metabolic panel   Collection Time: 07/03/23 10:56 AM  Result Value Ref Range   Glucose 83 70 - 99 mg/dL   BUN 11 6 - 20 mg/dL   Creatinine, Ser 0.86 0.57 - 1.00 mg/dL   eGFR 97 >57 QI/ONG/2.95   BUN/Creatinine Ratio 13 9 - 23   Sodium 139 134 - 144 mmol/L   Potassium 4.7 3.5 - 5.2 mmol/L   Chloride 103 96 - 106 mmol/L   CO2 21 20 - 29 mmol/L   Calcium 9.5  8.7 - 10.2 mg/dL   Total Protein 7.1 6.0 - 8.5 g/dL   Albumin 4.4 4.0 - 5.0 g/dL   Globulin, Total 2.7 1.5 - 4.5 g/dL   Bilirubin Total <2.8 0.0 - 1.2 mg/dL   Alkaline Phosphatase 55 44 - 121 IU/L   AST 12 0 - 40 IU/L   ALT 7 0 - 32 IU/L  Hemoglobin A1c   Collection Time: 07/03/23 10:56 AM  Result Value Ref Range   Hgb A1c MFr Bld 5.4 4.8 - 5.6 %   Est. average glucose Bld gHb Est-mCnc 108 mg/dL  Lipid panel   Collection Time: 07/03/23 10:56 AM  Result Value Ref Range   Cholesterol, Total 189 100 - 199 mg/dL   Triglycerides 413 0 - 149 mg/dL   HDL 67 >24 mg/dL   VLDL Cholesterol Cal 23 5 - 40 mg/dL   LDL Chol Calc (NIH) 99 0 - 99 mg/dL   Chol/HDL Ratio 2.8 0.0 - 4.4 ratio  TSH Rfx on Abnormal to Free T4   Collection Time: 07/03/23 10:56 AM  Result Value Ref Range   TSH 2.740 0.450 - 4.500 uIU/mL    Assessment & Plan:   1. Wellness examination (Primary) - Encouraged a healthy well-balanced diet. Patient may adjust caloric intake to maintain or achieve ideal body weight. May reduce intake of dietary saturated fat and total fat and have adequate dietary potassium and calcium preferably from fresh fruits, vegetables, and low-fat dairy products.   - Advised to avoid cigarette smoking. - Discussed with the patient that most people either abstain from alcohol or drink within safe limits (<=14/week and <=4 drinks/occasion for males, <=7/weeks and <= 3 drinks/occasion for females) and that the risk for alcohol disorders and other health effects rises proportionally with the number of drinks per week and how often a drinker exceeds daily limits. - Discussed cessation/primary prevention of drug use and availability of treatment for abuse.  - Discussed sexually transmitted diseases, avoidance of unintended pregnancy and contraceptive alternatives. - Stressed the importance of regular exercise - Injury prevention: Discussed safety belts, safety helmets, smoke detector, smoking near bedding or  upholstery.  - Dental health: Discussed importance of regular tooth brushing, flossing, and dental visits.  - Vaccines are UTD.  -  Plan for cervical cancer screening at next visit.   2. Mild intermittent asthma without complication Refill requested and sent to pharmacy.  - albuterol (VENTOLIN HFA) 108 (90 Base) MCG/ACT inhaler; Inhale 2 puffs into the lungs every 6 (six) hours as needed for wheezing.  Dispense: 6.7 g; Refill: 2  3. Screen for sexually transmitted diseases Would like to complete routine STI testing.  - HIV Antibody (routine testing w rflx) - HCV RNA quant rflx ultra or genotyp - RPR - NuSwab Vaginitis Plus (VG+)  NEXT PREVENTATIVE PHYSICAL DUE IN 1 YEAR.  Return in about 6 weeks (around 09/11/2023) for Bowel habits with pap smear .  Patient to reach out to office if new, worrisome, or unresolved symptoms arise or if no improvement in patient's condition. Patient verbalized understanding and is agreeable to treatment plan. All questions answered to patient's satisfaction.   Alyson Reedy, FNP

## 2023-07-31 NOTE — Patient Instructions (Signed)
 MyChart:  For all urgent or time sensitive needs we ask that you please call the office to avoid delays. Our number is [phone]. MyChart is not constantly monitored and due to the large volume of messages a day, replies may take up to 72 business hours.   MyChart Policy: MyChart allows for you to see your visit notes, after visit summary, provider recommendations, lab and tests results, make an appointment, request refills, and contact your provider or the office for non-urgent questions or concerns. Providers are seeing patients during normal business hours and do not have built in time to review MyChart messages.  We ask that you allow a minimum of 3 business days for responses to [employer]. For this reason, please do not send urgent requests through MyChart. Please call the office at [phone]. New and ongoing conditions may require a visit. We have virtual and in person visit available for your convenience.  Complex MyChart concerns may require a visit. Your provider may request you schedule a virtual or in person visit to ensure we are providing the best care possible. MyChart messages sent after 11:00 AM on Friday will not be received by the provider until Monday morning.    Lab and Test Results: You will receive your lab and test results on MyChart as soon as they are completed and results have been sent by the lab or testing facility. Due to this service, you will receive your results BEFORE your provider.  I review lab and tests results each morning prior to seeing patients. Some results require collaboration with other providers to ensure you are receiving the most appropriate care. For this reason, we ask that you please allow a minimum of 3-5 business days from the time the ALL results have been received for your provider to receive and review lab and test results and contact you about these.  Most lab and test result comments from the provider will be sent through MyChart.  Your provider may recommend changes to the plan of care, follow-up visits, repeat testing, ask questions, or request an office visit to discuss these results. You may reply directly to this message or call the office at [phone] to provide information for the provider or set up an appointment. In some instances, you will be called with test results and recommendations. Please let us know if this is preferred and we will make note of this in your chart to provide this for you.    If you have not heard a response to your lab or test results in 5 business days from all results returning to MyChart, please call the office to let us know. We ask that you please avoid calling prior to this time unless there is an emergent concern. Due to high call volumes, this can delay the resulting process.   After Hours: For all non-emergency after hours needs, please call the office at [phone] and select the option to reach the on-call provider service. On-call services are shared between multiple [place] offices and therefore it will not be possible to speak directly with your provider. On-call providers may provide medical advice and recommendations, but are unable to provide refills for maintenance medications.  For all emergency or urgent medical needs after normal business hours, we recommend that you seek care at the closest Urgent Care or Emergency Department to ensure appropriate treatment in a timely manner.  [facility] [place] has a 24 hour emergency room located on the ground floor for your  convenience.    Urgent Concerns During the Business Day Providers are seeing patients from 8AM to 5PM, Monday through Thursday, and 8AM to 12PM on Friday with a busy schedule and are most often not able to respond to non-urgent calls until the end of the day or the next business day. If you should have URGENT concerns during the day, please call and speak to the nurse or schedule a same day  appointment so that we can address your concern without delay.    Thank you, again, for choosing me as your health care partner. I appreciate your trust and look forward to learning more about you.    Rita Reedy, FNP-C

## 2023-08-01 LAB — HCV RNA QUANT RFLX ULTRA OR GENOTYP: HCV Quant Baseline: NOT DETECTED [IU]/mL

## 2023-08-01 LAB — HIV ANTIBODY (ROUTINE TESTING W REFLEX): HIV Screen 4th Generation wRfx: NONREACTIVE

## 2023-08-01 LAB — RPR: RPR Ser Ql: NONREACTIVE

## 2023-08-02 ENCOUNTER — Encounter: Payer: Self-pay | Admitting: Family Medicine

## 2023-08-02 LAB — NUSWAB VAGINITIS PLUS (VG+)
Candida albicans, NAA: NEGATIVE
Candida glabrata, NAA: NEGATIVE
Chlamydia trachomatis, NAA: NEGATIVE
Neisseria gonorrhoeae, NAA: NEGATIVE
Trich vag by NAA: NEGATIVE

## 2023-09-03 ENCOUNTER — Encounter: Payer: Self-pay | Admitting: Family Medicine

## 2023-09-04 MED ORDER — DROSPIRENONE-ETHINYL ESTRADIOL 3-0.03 MG PO TABS: 1.0000 | ORAL_TABLET | Freq: Every day | ORAL | 12 refills | Status: AC

## 2023-09-11 ENCOUNTER — Encounter: Payer: Self-pay | Admitting: Family Medicine

## 2023-09-11 ENCOUNTER — Ambulatory Visit: Admitting: Family Medicine

## 2023-09-11 VITALS — BP 109/75 | HR 77 | Ht 67.0 in | Wt 132.8 lb

## 2023-09-11 DIAGNOSIS — R198 Other specified symptoms and signs involving the digestive system and abdomen: Secondary | ICD-10-CM | POA: Diagnosis not present

## 2023-09-11 NOTE — Patient Instructions (Signed)
 MyChart:  For all urgent or time sensitive needs we ask that you please call the office to avoid delays. Our number is [phone]) [phone]. MyChart is not constantly monitored and due to the large volume of messages a day, replies may take up to 72 business hours.   MyChart Policy: MyChart allows for you to see your visit notes, after visit summary, provider recommendations, lab and tests results, make an appointment, request refills, and contact your provider or the office for non-urgent questions or concerns. Providers are seeing patients during normal business hours and do not have built in time to review MyChart messages.  We ask that you allow a minimum of 3 business days for responses to [employer]. For this reason, please do not send urgent requests through MyChart. Please call the office at [phone]. New and ongoing conditions may require a visit. We have virtual and in person visit available for your convenience.  Complex MyChart concerns may require a visit. Your provider may request you schedule a virtual or in person visit to ensure we are providing the best care possible. MyChart messages sent after 11:00 AM on Friday will not be received by the provider until Monday morning.    Lab and Test Results: You will receive your lab and test results on MyChart as soon as they are completed and results have been sent by the lab or testing facility. Due to this service, you will receive your results BEFORE your provider.  I review lab and tests results each morning prior to seeing patients. Some results require collaboration with other providers to ensure you are receiving the most appropriate care. For this reason, we ask that you please allow a minimum of 3-5 business days from the time the ALL results have been received for your provider to receive and review lab and test results and contact you about these.  Most lab and test result comments from the provider will be sent through MyChart.  Your provider may recommend changes to the plan of care, follow-up visits, repeat testing, ask questions, or request an office visit to discuss these results. You may reply directly to this message or call the office at [phone] to provide information for the provider or set up an appointment. In some instances, you will be called with test results and recommendations. Please let us know if this is preferred and we will make note of this in your chart to provide this for you.    If you have not heard a response to your lab or test results in 5 business days from all results returning to MyChart, please call the office to let us know. We ask that you please avoid calling prior to this time unless there is an emergent concern. Due to high call volumes, this can delay the resulting process.   After Hours: For all non-emergency after hours needs, please call the office at [phone] and select the option to reach the on-call provider service. On-call services are shared between multiple [facility] offices and therefore it will not be possible to speak directly with your provider. On-call providers may provide medical advice and recommendations, but are unable to provide refills for maintenance medications.  For all emergency or urgent medical needs after normal business hours, we recommend that you seek care at the closest Urgent Care or Emergency Department to ensure appropriate treatment in a timely manner.  [facility] [place] has a 24 hour emergency room located on the ground floor for your  convenience.    Urgent Concerns During the Business Day Providers are seeing patients from 8AM to 5PM, Monday through Thursday, and 8AM to 12PM on Friday with a busy schedule and are most often not able to respond to non-urgent calls until the end of the day or the next business day. If you should have URGENT concerns during the day, please call and speak to the nurse or schedule a same day  appointment so that we can address your concern without delay.    Thank you, again, for choosing me as your health care partner. I appreciate your trust and look forward to learning more about you.    Rita Reedy, FNP-C

## 2023-09-11 NOTE — Progress Notes (Signed)
   Established Patient Office Visit  Subjective  Patient ID: Rita Walker, female    DOB: [birth year]  Age: 28 y.o. MRN: [id number]  Chief Complaint  Patient presents with   Medical Management of Chronic Issues    Pt is here today to have her pap smear performed. Does have information about her recent bowel habits to show. Wants to know if there are any supplements that would help her with the bowel movements.   FLUCTUATING BOWEL HABITS:  Rita Walker is a 28 year old female patient who presents today for abnormal bowel movements. She reports chronic issues with both diarrhea and constipation. Previous OV, we discussed keeping food diary. Today, she reports she did write down about 10 days worth of data with only a few days with how formed her bowel movements were. Notices her bowel movements are sometimes type 3-4 in the afternoon and type 6-7 in the evening. She reports many days she skips breakfast, eats a late lunch and then an early dinner-- and reports feeling fuller faster and having more diarrhea afterwards. Reports experiencing abdominal pain with BMs still.   ROS: see HPI     Objective:    BP 109/75 (BP Location: Right Arm, Patient Position: Sitting, Cuff Size: Normal)   Pulse 77   Ht 5\' 7"  (1.702 m)   Wt 132 lb 12.8 oz (60.2 kg)   LMP 08/30/2023 (Exact Date)   SpO2 97%   BMI 20.80 kg/m  BP Readings from Last 3 Encounters:  09/11/23 109/75  07/31/23 111/71  07/03/23 122/86    GENERAL APPEARANCE: Well-appearing, in NAD. Well nourished.  SKIN: Pink, warm and dry. Turgor normal. No rash, lesion, ulceration, or ecchymoses. Hair evenly distributed.  NECK: Supple, Trachea midline. Full ROM w/o pain or tenderness. No lymphadenopathy. Thyroid non-tender w/o enlargement or palpable masses.  RESPIRATORY: Chest wall symmetrical w/o masses. Respirations even and non-labored. Breath sounds clear to auscultation bilaterally. No wheezes, rales, rhonchi, or crackles. CARDIAC: S1,  S2 present, regular rate and rhythm. No gallops, murmurs, rubs, or clicks. PMI w/o lifts, heaves, or thrills. No carotid bruits. Capillary refill <2 seconds. Peripheral pulses 2+ bilaterally. GI: Abdomen soft w/o distention. Normoactive bowel sounds. No palpable masses or tenderness. No guarding or rebound tenderness. Liver and spleen w/o tenderness or enlargement. No CVA tenderness.  NEUROLOGIC: CN's II-XII intact. Motor strength symmetrical with no obvious weakness. No sensory deficits. DTR's 2+ symmetric bilaterally. Steady, even gait.  PSYCH/MENTAL STATUS: Alert, oriented x 3. Cooperative, appropriate mood and affect.     Assessment & Plan:   1. Abnormal bowel movement (Primary) Patient is a pleasant 28 year old female patient who presents for chronic issues with frequent bowel movements, fluctuating between constipation and diarrhea, with abdominal bloating. Denies chest pain, severe abdominal pain, nausea/vomiting, indigestion, hematochezia, melena, urinary symptoms, and rectal pain/bleeding. Physical exam with active bowel sounds. No tenderness to palpation of abdomen. No rebound tenderness or guarding present. No CVA tenderness present. Discussed use of imodium with looser stools, increasing fiber intake, adequate hydration,  and avoidance of constipation. Counseled patient about following a regular eating schedule and not having meals too close together. Patient will notify office if she would like referral to GI in the future.   Return if symptoms worsen or fail to improve.    Wilhelmena Hanson, FNP

## 2023-09-21 ENCOUNTER — Ambulatory Visit: Payer: Self-pay | Admitting: Internal Medicine
